# Patient Record
Sex: Male | Born: 1990 | Race: White | Hispanic: No | Marital: Single | Smoking: Current every day smoker
Health system: Southern US, Community
[De-identification: ages and names within clinical notes are randomized; demographics above are authoritative.]

---

## 2012-11-03 ENCOUNTER — Emergency Department
Admission: EM | Admit: 2012-11-03 | Discharge: 2012-11-03 | Disposition: A | Discharge status: Home / Self Care | Payer: 59 | Source: Home / Self Care | Attending: Family Medicine | Admitting: Family Medicine

## 2012-11-03 ENCOUNTER — Emergency Department (INDEPENDENT_AMBULATORY_CARE_PROVIDER_SITE_OTHER): Payer: 59

## 2012-11-03 ENCOUNTER — Ambulatory Visit (INDEPENDENT_AMBULATORY_CARE_PROVIDER_SITE_OTHER): Payer: 59 | Admitting: Sports Medicine

## 2012-11-03 ENCOUNTER — Encounter: Payer: Self-pay | Admitting: *Deleted

## 2012-11-03 DIAGNOSIS — S62309A Unspecified fracture of unspecified metacarpal bone, initial encounter for closed fracture: Secondary | ICD-10-CM

## 2012-11-03 DIAGNOSIS — S62308A Unspecified fracture of other metacarpal bone, initial encounter for closed fracture: Secondary | ICD-10-CM

## 2012-11-03 DIAGNOSIS — S62319A Displaced fracture of base of unspecified metacarpal bone, initial encounter for closed fracture: Secondary | ICD-10-CM

## 2012-11-03 DIAGNOSIS — S62304D Unspecified fracture of fourth metacarpal bone, right hand, subsequent encounter for fracture with routine healing: Secondary | ICD-10-CM

## 2012-11-03 DIAGNOSIS — X58XXXA Exposure to other specified factors, initial encounter: Secondary | ICD-10-CM

## 2012-11-03 DIAGNOSIS — S62304A Unspecified fracture of fourth metacarpal bone, right hand, initial encounter for closed fracture: Secondary | ICD-10-CM | POA: Insufficient documentation

## 2012-11-03 MED ORDER — HYDROCODONE-ACETAMINOPHEN 5-325 MG PO TABS: 1.0000 | ORAL_TABLET | Freq: Three times a day (TID) | ORAL | Status: DC | PRN
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
## 2012-11-03 NOTE — ED Notes (Signed)
Pt c/o RT hand and wrist pain x this morning after punching his truck. No OTC meds.

## 2012-11-03 NOTE — Assessment & Plan Note (Signed)
Fracture to the base of the fourth metacarpal. Velcro wrist brace until swelling resolves. Hydrocodone for pain. Return to see me Wednesday or Tuesday of next week, and I will place a short arm cast.  I billed a fracture code for this visit, all subsequent visits for this complaint will be "post-op checks" in the global period.

## 2012-11-08 ENCOUNTER — Encounter: Payer: Self-pay | Admitting: Sports Medicine

## 2012-11-08 ENCOUNTER — Ambulatory Visit (INDEPENDENT_AMBULATORY_CARE_PROVIDER_SITE_OTHER): Payer: 59 | Admitting: Sports Medicine

## 2012-11-08 VITALS — BP 142/83 | HR 80 | Wt 201.0 lb

## 2012-11-08 DIAGNOSIS — S62309A Unspecified fracture of unspecified metacarpal bone, initial encounter for closed fracture: Secondary | ICD-10-CM

## 2012-11-08 DIAGNOSIS — S62304D Unspecified fracture of fourth metacarpal bone, right hand, subsequent encounter for fracture with routine healing: Secondary | ICD-10-CM

## 2012-11-08 MED ORDER — HYDROCODONE-ACETAMINOPHEN 10-325 MG PO TABS: 1.0000 | ORAL_TABLET | Freq: Three times a day (TID) | ORAL | Status: DC | PRN

## 2012-11-08 NOTE — Progress Notes (Signed)
  Subjective: Kenneth Padilla is now approximately one-week status post fracture of the base of the fourth metacarpal bone after punching his truck. He has been in a Velcro brace. Swelling is improving, pain is much better.   Objective: General: Well-developed, well-nourished, and in no acute distress. There is some mild tenderness to palpation over the base of the fourth metacarpal.  X-rays were reviewed and show stable alignment of the fracture.  Short arm cast was placed.  Assessment/plan:

## 2012-11-08 NOTE — Assessment & Plan Note (Signed)
Swelling has resolved, short arm cast placed. Refilling pain medicine with hydrocodone 10/325. Return to see me in 4 weeks x-ray before visit.

## 2012-11-15 ENCOUNTER — Ambulatory Visit (INDEPENDENT_AMBULATORY_CARE_PROVIDER_SITE_OTHER): Payer: 59 | Admitting: Sports Medicine

## 2012-11-15 ENCOUNTER — Encounter: Payer: Self-pay | Admitting: Sports Medicine

## 2012-11-15 VITALS — BP 129/75 | HR 65 | Wt 197.0 lb

## 2012-11-15 DIAGNOSIS — S62309A Unspecified fracture of unspecified metacarpal bone, initial encounter for closed fracture: Secondary | ICD-10-CM

## 2012-11-15 DIAGNOSIS — S62304D Unspecified fracture of fourth metacarpal bone, right hand, subsequent encounter for fracture with routine healing: Secondary | ICD-10-CM

## 2012-11-15 NOTE — Assessment & Plan Note (Signed)
Return for previously scheduled followup for fracture check.

## 2012-11-15 NOTE — Progress Notes (Signed)
  Subjective: Approximately one week status post nondisplaced fracture at the base of the fourth metacarpal in the right hand after punching his truck. He was feeling some irritation the cast.   Objective: General: Well-developed, well-nourished, and in no acute distress. Cast was removed, noticed no skin breakdown.  Apply new short arm cast, much lower profile.  Assessment/plan:

## 2012-11-16 ENCOUNTER — Ambulatory Visit (INDEPENDENT_AMBULATORY_CARE_PROVIDER_SITE_OTHER): Payer: 59 | Admitting: Sports Medicine

## 2012-11-16 ENCOUNTER — Encounter: Payer: Self-pay | Admitting: Sports Medicine

## 2012-11-16 VITALS — BP 132/78 | HR 63 | Wt 201.0 lb

## 2012-11-16 DIAGNOSIS — S62304D Unspecified fracture of fourth metacarpal bone, right hand, subsequent encounter for fracture with routine healing: Secondary | ICD-10-CM

## 2012-11-16 DIAGNOSIS — IMO0001 Reserved for inherently not codable concepts without codable children: Secondary | ICD-10-CM

## 2012-11-16 NOTE — Assessment & Plan Note (Signed)
Continued discomfort in cast. I applied a Velcro wrist brace, and braced with Coban. I still need to see him for his three-week followup visit.

## 2012-11-16 NOTE — Progress Notes (Signed)
  Subjective: Kenneth Padilla is only about a week out from fracture of the base of the fourth metacarpal his right hand after punching his truck. I changed his cast at the last visit yesterday, unfortunately he's having some irritation over the fifth digit as well as the first webspace.  Objective: General: Well-developed, well-nourished, and in no acute distress. Cast is still in good condition, he does have a slight irritation at the first webspace. Cast was removed. We replaced the Velcro brace, and I strapped this with compressive Coban/dressing.  Assessment/plan:

## 2012-12-04 ENCOUNTER — Ambulatory Visit (INDEPENDENT_AMBULATORY_CARE_PROVIDER_SITE_OTHER): Payer: 59 | Admitting: Sports Medicine

## 2012-12-04 ENCOUNTER — Ambulatory Visit (INDEPENDENT_AMBULATORY_CARE_PROVIDER_SITE_OTHER): Payer: 59

## 2012-12-04 ENCOUNTER — Encounter: Payer: Self-pay | Admitting: Sports Medicine

## 2012-12-04 VITALS — BP 142/82 | HR 62 | Wt 199.0 lb

## 2012-12-04 DIAGNOSIS — IMO0001 Reserved for inherently not codable concepts without codable children: Secondary | ICD-10-CM

## 2012-12-04 DIAGNOSIS — S62304D Unspecified fracture of fourth metacarpal bone, right hand, subsequent encounter for fracture with routine healing: Secondary | ICD-10-CM

## 2012-12-04 MED ORDER — HYDROCODONE-ACETAMINOPHEN 10-325 MG PO TABS: 1.0000 | ORAL_TABLET | Freq: Three times a day (TID) | ORAL | Status: DC | PRN

## 2012-12-04 NOTE — Progress Notes (Signed)
  Subjective: Three-week status post nondisplaced fracture of the right fourth metacarpal base. Doing well. Did have an injury, felt a pop. He has been intolerant of the cast on 3 occasions so far.   Objective: General: Well-developed, well-nourished, and in no acute distress. Still tender over the fracture site.  The hand was restrapped with compressive dressing and brace was reapplied.  Assessment/plan:

## 2012-12-04 NOTE — Assessment & Plan Note (Signed)
Continue compression. Continue brace. Refill pain medication. Return in 3 weeks, he will likely be healed at that point.

## 2012-12-25 ENCOUNTER — Ambulatory Visit: Payer: 59 | Admitting: Sports Medicine

## 2013-01-04 ENCOUNTER — Ambulatory Visit (INDEPENDENT_AMBULATORY_CARE_PROVIDER_SITE_OTHER): Payer: 59 | Admitting: Sports Medicine

## 2013-01-04 ENCOUNTER — Encounter: Payer: Self-pay | Admitting: Sports Medicine

## 2013-01-04 VITALS — BP 140/75 | HR 60 | Wt 198.0 lb

## 2013-01-04 DIAGNOSIS — S62304D Unspecified fracture of fourth metacarpal bone, right hand, subsequent encounter for fracture with routine healing: Secondary | ICD-10-CM

## 2013-01-04 DIAGNOSIS — S62309A Unspecified fracture of unspecified metacarpal bone, initial encounter for closed fracture: Secondary | ICD-10-CM

## 2013-01-04 MED ORDER — HYDROCODONE-ACETAMINOPHEN 5-325 MG PO TABS: 1.0000 | ORAL_TABLET | Freq: Three times a day (TID) | ORAL | Status: DC | PRN

## 2013-01-04 NOTE — Progress Notes (Signed)
  Subjective: Kenneth Padilla is now 7 weeks status post fracture of the right fourth metacarpal bone. Pain continues to improve, almost completely gone. He was unable to tolerate cast for the majority of his treatment course, and we kept him in a Velcro wrist brace.   Objective: General: Well-developed, well-nourished, and in no acute distress. Right hand: Essentially no tenderness to palpation over the fracture site. No swelling, no bruising. Excellent strength.  Assessment/plan:

## 2013-01-04 NOTE — Assessment & Plan Note (Signed)
Fracture is healing well. I strapped his hand with compressive dressing. Refilled hydrocodone only 5/325, #20. Return to see me in 2 weeks.

## 2013-01-19 ENCOUNTER — Ambulatory Visit (INDEPENDENT_AMBULATORY_CARE_PROVIDER_SITE_OTHER): Payer: 59 | Admitting: Sports Medicine

## 2013-01-19 ENCOUNTER — Encounter: Payer: Self-pay | Admitting: Sports Medicine

## 2013-01-19 VITALS — BP 140/91 | HR 51 | Wt 201.0 lb

## 2013-01-19 DIAGNOSIS — I7300 Raynaud's syndrome without gangrene: Secondary | ICD-10-CM

## 2013-01-19 MED ORDER — AMLODIPINE BESYLATE 5 MG PO TABS: ORAL_TABLET | ORAL | Status: DC

## 2013-01-19 NOTE — Patient Instructions (Signed)
Raynaud's Syndrome Raynaud's Syndrome is a disorder of the blood vessels in your hands and feet. It occurs when small arteries of the arms/hands or legs/feet become sensitive to cold or emotional upset. This causes the arteries to constrict, or narrow, and reduces blood flow to the area. The color in the fingers or toes changes from white to bluish to red and this is not usually painful. There may be numbness and tingling. Sores on the skin (ulcers) can form. Symptoms are usually relieved by warming. HOME CARE INSTRUCTIONS   Avoid exposure to cold. Keep your whole body warm and dry. Dress in layers. Wear mittens or gloves when handling ice or frozen food and when outdoors. Use holders for glasses or cans containing cold drinks. If possible, stay indoors during cold weather.  Limit your use of caffeine. Switch to decaffeinated coffee, tea, and soda pop. Avoid chocolate.  Avoid smoking or being around cigarette smoke. Smoke will make symptoms worse.  Wear loose fitting socks and comfortable, roomy shoes.  Avoid vibrating tools and machinery.  If possible, avoid stressful and emotional situations. Exercise, meditation and yoga may help you cope with stress. Biofeedback may be useful.  Ask your caregiver about medicine (calcium channel blockers) that may control Raynaud's phenomena. SEEK MEDICAL CARE IF:   Your discomfort becomes worse, despite conservative treatment.  You develop sores on your fingers and toes that do not heal. Document Released: 02/20/2000 Document Revised: 05/17/2011 Document Reviewed: 02/27/2008 [employer] Patient Information 2014 [employer].  

## 2013-01-19 NOTE — Progress Notes (Signed)
  Subjective:    CC: Followup  HPI: Fracture: 9 weeks status post fracture at the base of the fourth metacarpal bone of the right hand, healed, pain-free.  Pain in the toes and fingers: Occurs whenever it gets cold outside, they intend to turn colors, white, then red when rewarmed. Extremely painful. Moderate, persistent. Has never been treated or diagnosed before.  Past medical history, Surgical history, Family history not pertinant except as noted below, Social history, Allergies, and medications have been entered into the medical record, reviewed, and no changes needed.   Review of Systems: No fevers, chills, night sweats, weight loss, chest pain, or shortness of breath.   Objective:    General: Well Developed, well nourished, and in no acute distress.  Neuro: Alert and oriented x3, extra-ocular muscles intact, sensation grossly intact.  HEENT: Normocephalic, atraumatic, pupils equal round reactive to light, neck supple, no masses, no lymphadenopathy, thyroid nonpalpable.  Skin: Warm and dry, no rashes. Cardiac: Regular rate and rhythm, no murmurs rubs or gallops, no lower extremity edema.  Respiratory: Clear to auscultation bilaterally. Not using accessory muscles, speaking in full sentences. Right hand: No longer tender to palpation at the base of fourth metacarpal bone. Fingers are cold.  Impression and Recommendations:

## 2013-01-19 NOTE — Assessment & Plan Note (Signed)
Starting low dose amlodipine 5 mg one half tab daily. Avoidance with gloves and warm socks. Return to see me in a month to see how things are going.

## 2013-02-16 ENCOUNTER — Ambulatory Visit (INDEPENDENT_AMBULATORY_CARE_PROVIDER_SITE_OTHER): Payer: 59 | Admitting: Sports Medicine

## 2013-02-16 ENCOUNTER — Encounter: Payer: Self-pay | Admitting: Sports Medicine

## 2013-02-16 VITALS — BP 143/78 | HR 63 | Wt 199.0 lb

## 2013-02-16 DIAGNOSIS — I7300 Raynaud's syndrome without gangrene: Secondary | ICD-10-CM

## 2013-02-16 DIAGNOSIS — L909 Atrophic disorder of skin, unspecified: Secondary | ICD-10-CM

## 2013-02-16 DIAGNOSIS — L74519 Primary focal hyperhidrosis, unspecified: Secondary | ICD-10-CM

## 2013-02-16 DIAGNOSIS — I10 Essential (primary) hypertension: Secondary | ICD-10-CM

## 2013-02-16 DIAGNOSIS — L918 Other hypertrophic disorders of the skin: Secondary | ICD-10-CM | POA: Insufficient documentation

## 2013-02-16 DIAGNOSIS — L74512 Primary focal hyperhidrosis, palms: Secondary | ICD-10-CM | POA: Insufficient documentation

## 2013-02-16 MED ORDER — AMLODIPINE BESYLATE 10 MG PO TABS: 10.0000 mg | ORAL_TABLET | Freq: Every day | ORAL | Status: DC

## 2013-02-16 NOTE — Progress Notes (Signed)
  Subjective:    CC: Follow up  HPI: Raynaud phenomenon: Improved significantly on 5 mg of amlodipine.  Hypertension: Improved with amlodipine.  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  Skin lesion: Has several skin tags on the ear, and a couple in the abdomen.  Past medical history, Surgical history, Family history not pertinant except as noted below, Social history, Allergies, and medications have been entered into the medical record, reviewed, and no changes needed.   Review of Systems: No fevers, chills, night sweats, weight loss, chest pain, or shortness of breath.   Objective:    General: Well Developed, well nourished, and in no acute distress.  Neuro: Alert and oriented x3, extra-ocular muscles intact, sensation grossly intact.  HEENT: Normocephalic, atraumatic, pupils equal round reactive to light, neck supple, no masses, no lymphadenopathy, thyroid nonpalpable.  Skin: Warm and dry, no rashes. Skin tag on the left ear, also 3 on the abdomen. Hyperhidrosis of the hands. Cardiac: Regular rate and rhythm, no murmurs rubs or gallops, no lower extremity edema.  Respiratory: Clear to auscultation bilaterally. Not using accessory muscles, speaking in full sentences.  Impression and Recommendations:

## 2013-02-16 NOTE — Assessment & Plan Note (Signed)
Increasing amlodipine to 10 mg daily. Recheck in one month.

## 2013-02-16 NOTE — Assessment & Plan Note (Signed)
Will make appointment for excision of four skin tags.

## 2013-02-16 NOTE — Assessment & Plan Note (Signed)
Increasing amlodipine to 10mg daily.

## 2013-02-16 NOTE — Assessment & Plan Note (Signed)
At this point I am going to refer him to dermatology for consideration of Botox treatments.

## 2013-02-22 ENCOUNTER — Ambulatory Visit: Payer: 59 | Admitting: Sports Medicine

## 2013-02-26 ENCOUNTER — Ambulatory Visit (INDEPENDENT_AMBULATORY_CARE_PROVIDER_SITE_OTHER): Payer: 59 | Admitting: Sports Medicine

## 2013-02-26 ENCOUNTER — Encounter: Payer: Self-pay | Admitting: Sports Medicine

## 2013-02-26 VITALS — BP 145/78 | HR 63 | Wt 199.0 lb

## 2013-02-26 DIAGNOSIS — L918 Other hypertrophic disorders of the skin: Secondary | ICD-10-CM

## 2013-02-26 DIAGNOSIS — L909 Atrophic disorder of skin, unspecified: Secondary | ICD-10-CM

## 2013-02-26 NOTE — Assessment & Plan Note (Signed)
5 abdominal skin tags removed as above.

## 2013-02-26 NOTE — Progress Notes (Signed)
  Subjective:    CC:  Skin tags  HPI:  this pleasant 22 year old male has multiple skin tags on his abdomen and his left ear, he presents for removal.  Past medical history, Surgical history, Family history not pertinant except as noted below, Social history, Allergies, and medications have been entered into the medical record, reviewed, and no changes needed.   Review of Systems: No fevers, chills, night sweats, weight loss, chest pain, or shortness of breath.   Objective:    General: Well Developed, well nourished, and in no acute distress.  Neuro: Alert and oriented x3, extra-ocular muscles intact, sensation grossly intact.  HEENT: Normocephalic, atraumatic, pupils equal round reactive to light, neck supple, no masses, no lymphadenopathy, thyroid nonpalpable.  Skin: Warm and dry, no rashes. Cardiac: Regular rate and rhythm, no murmurs rubs or gallops, no lower extremity edema.  Respiratory: Clear to auscultation bilaterally. Not using accessory muscles, speaking in full sentences.  Procedure: Removal of 5 abdominal skin tags.  Risks, benefits, and alternatives explained and consent obtained. Time out conducted. Surface prepped in a sterile fashion. 1cc lidocaine with epinephine infiltrated under skin tag Adequate anesthesia ensured. Skin tag excised from skin surface. Dressing applied. Pt stable. Pt advised to call or RTC for continued bleeding, spreading erythema/induration, fevers, or chills.  Impression and Recommendations:

## 2013-08-23 ENCOUNTER — Ambulatory Visit (INDEPENDENT_AMBULATORY_CARE_PROVIDER_SITE_OTHER): Payer: 59 | Admitting: Sports Medicine

## 2013-08-23 ENCOUNTER — Encounter: Payer: Self-pay | Admitting: Sports Medicine

## 2013-08-23 VITALS — BP 128/92 | HR 56 | Ht 77.0 in | Wt 186.0 lb

## 2013-08-23 DIAGNOSIS — H9190 Unspecified hearing loss, unspecified ear: Secondary | ICD-10-CM

## 2013-08-23 DIAGNOSIS — F329 Major depressive disorder, single episode, unspecified: Secondary | ICD-10-CM

## 2013-08-23 DIAGNOSIS — G43909 Migraine, unspecified, not intractable, without status migrainosus: Secondary | ICD-10-CM | POA: Insufficient documentation

## 2013-08-23 DIAGNOSIS — R6889 Other general symptoms and signs: Secondary | ICD-10-CM

## 2013-08-23 DIAGNOSIS — L568 Other specified acute skin changes due to ultraviolet radiation: Secondary | ICD-10-CM

## 2013-08-23 MED ORDER — VENLAFAXINE HCL ER 37.5 MG PO CP24: ORAL_CAPSULE | ORAL | Status: DC

## 2013-08-23 MED ORDER — VENLAFAXINE HCL ER 75 MG PO CP24: 75.0000 mg | ORAL_CAPSULE | Freq: Every day | ORAL | Status: DC

## 2013-08-23 NOTE — Assessment & Plan Note (Signed)
With a normal exam of vision and hearing, as well as significant changes at home, I do think this represents major depressive disorder. Referral downstairs for behavioral therapy, I am going to start Effexor. Return in 2 weeks. No SI/HI.

## 2013-08-23 NOTE — Assessment & Plan Note (Signed)
Hearing test is normal. I do think he is experiencing some exposure related hearing loss due to loud machinery and music. At this point I'm going to defer referral to audiology.

## 2013-08-23 NOTE — Assessment & Plan Note (Addendum)
Exam is benign. I do think that this is a normal phenomenon. At this point we are going to defer referral to ophthalmology.
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
## 2013-08-24 ENCOUNTER — Telehealth: Payer: Self-pay

## 2013-08-24 NOTE — Telephone Encounter (Signed)
Patient has been notified. Rhonda Cunningham,CMA  

## 2013-08-24 NOTE — Telephone Encounter (Signed)
Patient called with concerns he is taking Effexor 37.5 mg and he will be switching it to the 75 mg he is taking it in the morning with breakfast he wants to know if he can take it at night because it makes him sleepy. Rhonda Cunningham,CMA

## 2013-08-24 NOTE — Telephone Encounter (Signed)
Yes, that's fine 

## 2013-09-05 ENCOUNTER — Encounter (HOSPITAL_COMMUNITY): Payer: Self-pay | Admitting: Physician Assistant

## 2013-09-05 ENCOUNTER — Ambulatory Visit (INDEPENDENT_AMBULATORY_CARE_PROVIDER_SITE_OTHER): Payer: 59 | Admitting: Physician Assistant

## 2013-09-05 VITALS — BP 120/70 | HR 61 | Ht 77.0 in | Wt 194.0 lb

## 2013-09-05 DIAGNOSIS — F1994 Other psychoactive substance use, unspecified with psychoactive substance-induced mood disorder: Secondary | ICD-10-CM | POA: Insufficient documentation

## 2013-09-05 DIAGNOSIS — F1210 Cannabis abuse, uncomplicated: Secondary | ICD-10-CM | POA: Insufficient documentation

## 2013-09-05 DIAGNOSIS — F411 Generalized anxiety disorder: Secondary | ICD-10-CM

## 2013-09-05 NOTE — Progress Notes (Signed)
Psychiatric Assessment Adult  Patient Identification:  Linford ArnoldZachary Zody Date of Evaluation:  09/05/2013 Chief Complaint: Depression History of Chief Complaint:   Chief Complaint  Patient presents with  . Depression  .Location: [facility] out patient .Quality: Chronic .Severity: moderate to severe .Timing :   Symptoms have never stopped .Duration: since age 23 .Context:  Every single day he is depressed, and he hates his life. 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  He notes that he has seen a number of therapists, thinks he has ADD and functions better when he takes Adderall he gets from a friend. He states he has cut down on smoking THC over the past month, and is now taking Effexor XR 75mg  x 2 weeks.  HPI Review of Systems Physical Exam  Depressive Symptoms: depressed mood, anhedonia, insomnia, psychomotor agitation, fatigue, feelings of worthlessness/guilt, difficulty concentrating, hopelessness, impaired memory, recurrent thoughts of death, anxiety, panic attacks, weight loss,  (Hypo) Manic Symptoms:   Elevated Mood:  Yes Irritable Mood:  Yes Grandiosity:  No Distractibility:  Yes Labiality of Mood:  Yes Delusions:  No Hallucinations:  No Impulsivity:  Yes Sexually Inappropriate Behavior:  No Financial Extravagance:  Yes Flight of Ideas:  No  Anxiety Symptoms: Excessive Worry:  Yes Panic Symptoms:  Yes Agoraphobia:  No Obsessive Compulsive: No  Symptoms: None, Specific Phobias:  No Social Anxiety:  No  Psychotic Symptoms:  Hallucinations: No None Delusions:  No Paranoia:  No   Ideas of Reference:  No  PTSD Symptoms: Ever had a traumatic exposure:  No Had a traumatic exposure in the last month:  Yes Re-experiencing: No None Hypervigilance:  No Hyperarousal: No None Avoidance: No None  Traumatic Brain Injury: No   Past Psychiatric History: Diagnosis: Depression   Hospitalizations: none  Outpatient Care:  Years ago  Substance Abuse Care: none  Self-Mutilation: none  Suicidal Attempts: 6, including OD, Suffocating, hanging, ran truck into woods.  Violent Behaviors: none   Past Medical History:  History reviewed. No pertinent past medical history. History of Loss of Consciousness:  No Seizure History:  No Cardiac History:  No Allergies:  No Known Allergies Current Medications:  Current Outpatient Prescriptions  Medication Sig Dispense Refill  . venlafaxine XR (EFFEXOR XR) 75 MG 24 hr capsule Take 1 capsule (75 mg total) by mouth daily with breakfast.  30 capsule  1  . venlafaxine XR (EFFEXOR XR) 37.5 MG 24 hr capsule One tab by mouth daily for 3 days then switch to the higher dose  3 capsule  0   No current facility-administered medications for this visit.    Previous Psychotropic Medications:  Medication Dose   Strattera      zoloft made him sick and tired     Symbiax did well x 8 months    Prozac     Wellbutrin     Abilify      ?Paxil    Substance Abuse History in the last 12 months: THC 3-5 x a day since age 23, Acid x 3, cocaine last use 2009, snorting,  Recently started drinking again Medical Consequences of Substance Abuse: possession charges  Legal Consequences of Substance Abuse: no problems at this time.  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 Diabetes Father   . Hypertension Father     Mental Status Examination/Evaluation: Objective:  Appearance: Disheveled  Eye Contact::  Good  Speech:  Clear and Coherent  Volume:  Normal  Mood:  Anxious depressed  Affect:  Congruent and Tearful  Thought Process:  Coherent, Goal Directed, Linear and Logical  Orientation:  Full (Time, Place, and Person)  Thought Content:  WDL  Suicidal Thoughts:  Yes.  without intent/plan  Homicidal Thoughts:  No  Judgement:  Good  Insight:  Present  Psychomotor Activity:  Restlessness  Akathisia:  No  Handed:  Right  AIMS (if indicated):    Assets:  Communication Skills Desire for Improvement Financial Resources/Insurance Housing Physical Health Social Support Talents/Skills Transportation Vocational/Educational    Laboratory/X-Ray Psychological Evaluation(s)   UDS, CMP, CBC     Assessment:    AXIS I Cannabis abuse, SIMDO, r/o MDD vs BPD  AXIS II Deferred  AXIS III History reviewed. No pertinent past medical history.   AXIS IV problems related to social environment and problems with primary support group  AXIS V 51-60 moderate symptoms   Treatment Plan/Recommendations:  Plan of Care: 1. Continue Effexor XR, take this in the AM. 2. Regular sleep wake schedule as discussed. 3. Continue to decrease alcohol and THC use as discussed. 4. Will order labs today as noted above. 5.  Patient will follow up in 2 weeks.  Laboratory:  CBC Chemistry Profile UDS  Psychotherapy: will consider OPT when a better therapeutic relationship is established and patient is more stable.  Medications: Continue Effexor XR 75 mg in AM.  Routine PRN Medications:  No  Consultations: if needed  Safety Concerns:  None at this time.  Other:      Imanie Darrow, PA-C 7/1/20152:17 PM

## 2013-09-06 ENCOUNTER — Ambulatory Visit (INDEPENDENT_AMBULATORY_CARE_PROVIDER_SITE_OTHER): Payer: 59 | Admitting: Sports Medicine

## 2013-09-06 ENCOUNTER — Encounter: Payer: Self-pay | Admitting: Sports Medicine

## 2013-09-06 VITALS — BP 141/84 | HR 55 | Ht 77.0 in | Wt 191.0 lb

## 2013-09-06 DIAGNOSIS — G43709 Chronic migraine without aura, not intractable, without status migrainosus: Secondary | ICD-10-CM

## 2013-09-06 DIAGNOSIS — F321 Major depressive disorder, single episode, moderate: Secondary | ICD-10-CM

## 2013-09-06 LAB — DRUG SCREEN, URINE
AMPHETAMINE SCRN UR: NEGATIVE
BARBITURATE QUANT UR: NEGATIVE
Benzodiazepines.: NEGATIVE
COCAINE METABOLITES: NEGATIVE
Creatinine,U: 94.01 mg/dL
MARIJUANA METABOLITE: POSITIVE — AB
METHADONE: NEGATIVE
Opiates: NEGATIVE
Phencyclidine (PCP): NEGATIVE
Propoxyphene: NEGATIVE

## 2013-09-06 LAB — CBC WITH DIFFERENTIAL/PLATELET
Basophils Absolute: 0 10*3/uL (ref 0.0–0.1)
Basophils Relative: 0 % (ref 0–1)
EOS ABS: 0.3 10*3/uL (ref 0.0–0.7)
EOS PCT: 3 % (ref 0–5)
HEMATOCRIT: 45.5 % (ref 39.0–52.0)
HEMOGLOBIN: 16 g/dL (ref 13.0–17.0)
LYMPHS ABS: 2.7 10*3/uL (ref 0.7–4.0)
LYMPHS PCT: 32 % (ref 12–46)
MCH: 31.5 pg (ref 26.0–34.0)
MCHC: 35.2 g/dL (ref 30.0–36.0)
MCV: 89.6 fL (ref 78.0–100.0)
MONOS PCT: 8 % (ref 3–12)
Monocytes Absolute: 0.7 10*3/uL (ref 0.1–1.0)
Neutro Abs: 4.8 10*3/uL (ref 1.7–7.7)
Neutrophils Relative %: 57 % (ref 43–77)
Platelets: 183 10*3/uL (ref 150–400)
RBC: 5.08 MIL/uL (ref 4.22–5.81)
RDW: 14.1 % (ref 11.5–15.5)
WBC: 8.4 10*3/uL (ref 4.0–10.5)

## 2013-09-06 LAB — COMPLETE METABOLIC PANEL WITH GFR
ALBUMIN: 4.6 g/dL (ref 3.5–5.2)
ALK PHOS: 53 U/L (ref 39–117)
ALT: 16 U/L (ref 0–53)
AST: 21 U/L (ref 0–37)
BILIRUBIN TOTAL: 0.3 mg/dL (ref 0.2–1.2)
BUN: 15 mg/dL (ref 6–23)
CO2: 31 meq/L (ref 19–32)
Calcium: 9.5 mg/dL (ref 8.4–10.5)
Chloride: 99 mEq/L (ref 96–112)
Creat: 0.94 mg/dL (ref 0.50–1.35)
GLUCOSE: 84 mg/dL (ref 70–99)
POTASSIUM: 4.5 meq/L (ref 3.5–5.3)
SODIUM: 139 meq/L (ref 135–145)
TOTAL PROTEIN: 6.7 g/dL (ref 6.0–8.3)

## 2013-09-06 MED ORDER — KETOROLAC TROMETHAMINE 30 MG/ML IJ SOLN
30.0000 mg | Freq: Once | INTRAMUSCULAR | Status: AC
  Administered 2013-09-06: 30 mg via INTRAMUSCULAR

## 2013-09-06 NOTE — Progress Notes (Signed)
  Subjective:    CC: Followup  HPI: Depression: Stable on Effexor 75 mg daily, he is also seeing psychiatry downstairs, no suicidal or homicidal ideation, does not yet feel better as expected.  Headache: bitemporal, throbbing, with photophobia, no nausea, no folliculitis and signs, trauma. No constitutional symptoms. Mild, persistent.  Past medical history, Surgical history, Family history not pertinant except as noted below, Social history, Allergies, and medications have been entered into the medical record, reviewed, and no changes needed.   Review of Systems: No fevers, chills, night sweats, weight loss, chest pain, or shortness of breath.   Objective:    General: Well Developed, well nourished, and in no acute distress.  Neuro: Alert and oriented x3, extra-ocular muscles intact, sensation grossly intact.  HEENT: Normocephalic, atraumatic, pupils equal round reactive to light, neck supple, no masses, no lymphadenopathy, thyroid nonpalpable.  Skin: Warm and dry, no rashes. Cardiac: Regular rate and rhythm, no murmurs rubs or gallops, no lower extremity edema.  Respiratory: Clear to auscultation bilaterally. Not using accessory muscles, speaking in full sentences.  Impression and Recommendations:

## 2013-09-06 NOTE — Assessment & Plan Note (Signed)
Toradol 30 mg intramuscular.

## 2013-09-06 NOTE — Assessment & Plan Note (Signed)
Continue venlafaxine 75 mg. Continue consultation with psychiatry. Not much better which is expected, continue medication, return to see me in one month.

## 2013-09-19 ENCOUNTER — Ambulatory Visit (HOSPITAL_COMMUNITY): Payer: Self-pay | Admitting: Physician Assistant

## 2013-09-21 ENCOUNTER — Ambulatory Visit (INDEPENDENT_AMBULATORY_CARE_PROVIDER_SITE_OTHER): Payer: 59 | Admitting: Physician Assistant

## 2013-09-21 ENCOUNTER — Encounter (HOSPITAL_COMMUNITY): Payer: Self-pay | Admitting: Physician Assistant

## 2013-09-21 ENCOUNTER — Encounter: Payer: Self-pay | Admitting: Physician Assistant

## 2013-09-21 VITALS — BP 133/73 | HR 66 | Ht 77.0 in | Wt 207.0 lb

## 2013-09-21 VITALS — BP 137/69 | HR 63 | Ht 77.0 in | Wt 208.0 lb

## 2013-09-21 DIAGNOSIS — F331 Major depressive disorder, recurrent, moderate: Secondary | ICD-10-CM

## 2013-09-21 DIAGNOSIS — Z7251 High risk heterosexual behavior: Secondary | ICD-10-CM

## 2013-09-21 MED ORDER — VENLAFAXINE HCL ER 75 MG PO CP24: 75.0000 mg | ORAL_CAPSULE | Freq: Every day | ORAL | Status: DC

## 2013-09-21 NOTE — Progress Notes (Signed)
   [facility]  Kenneth ArnoldZachary Padilla Apr 11, 1990  Date: 09/21/2013    Subjective: The patient is in today to follow up on his start of Effexor XR 75mg . He states he feels better and reports yawning as a side effect, but he does note that it makes him sleepy so Dr. Karie Schwalbe. Has instructed him to take it at night. Ep[employer] research did show yawning, insomnia, and somnolence as side effects.     He is getting more sleep at night and is making a strong effort to be home and in bed at a reasonable time. He also continues to cut down on his marijuana use.      Kenneth CoderZachary also notes that his 25 year old brother has Wegner's Granulomatosis and is in kidney failure. He will need to go on dialysis in the near future. Kenneth MalkinZach has painted a very dim view of the near future and feels that his brother is going to go down hill quickly as he is beginning to "look ill" and "he's tired all the time now." Filed Vitals:   09/21/13 0950  BP: 137/69  Pulse: 63    Mental Status Examination  Appearance: WDWN WM NAD Alert: Yes Attention: good  Cooperative: Yes Eye Contact: Good Speech: clear goal directed normal rate and rhythm Psychomotor Activity: Normal Memory/Concentration:  Oriented: place and time/date Mood: Anxious and Depressed states depression is worse than anxiety. Affect: Appropriate and Congruent Thought Processes and Associations: Goal Directed Fund of Knowledge: Good Thought Content:  Insight: Good Judgement: Good  Testing: Zung Depression scale:  53/80 = mild depression Zung Anxiety scale: Unable to complete due to poor internet connection.   Diagnosis:  AXIS I  Cannabis abuse, SIMDO, r/o MDD vs BPD   AXIS II  Deferred   AXIS III  History reviewed. No pertinent past medical history.   AXIS IV  problems related to social environment and problems with primary support group   AXIS V  51-60 moderate symptoms     Treatment Plan:  1. No changes in medications  at this time. 2. Discussed risk reduction and overall reduction of THC as very important to Kenneth Padilla recovery. 3.Stressed the importance of continuing to get plenty of rest on a regular basis. 4. Also recommend counseling with H. Coble for stress reduction and to decrease his negative perception habit even in the face of his brother's illness. 5. Follow up 4-6 weeks. Kenneth RavensNeil T. Padilla All RPAC 10:26 AM 09/21/2013

## 2013-09-21 NOTE — Patient Instructions (Signed)
1.continue plenty of rest and regular sleep. 2. Continue to cut down on THC. 3. Schedule with H. Coble 4. Follow up 6 weeks.

## 2013-09-21 NOTE — Progress Notes (Signed)
   Subjective:    Patient ID: Kenneth Padilla, male    DOB: [birth year], 23 y.o.   MRN: [id number]  HPI The patient is a 23 year old male who presents to the clinic to have STD testing. He recently has ended a relationship with his girlfriend. It was found out that she had cheated on him with multiple times. He generally likes to get STD testing when she really wanted to do so after ending the relationship with her. He denies any penile lesions, penile discharge, penile pain or any other complications or problems. He denies any fever at, chills today.     Review of Systems  All other systems reviewed and are negative.      Objective:   Physical Exam  Constitutional: He is oriented to person, place, and time. He appears well-developed and well-nourished.  HENT:  Head: Normocephalic and atraumatic.  Cardiovascular: Normal rate, regular rhythm and normal heart sounds.   Pulmonary/Chest: Effort normal and breath sounds normal.  Neurological: He is alert and oriented to person, place, and time.  Skin: Skin is dry.  Psychiatric: He has a normal mood and affect. His behavior is normal.          Assessment & Plan:  High-risk sexual behavior-HIV, RPR, herpes was printed out for patient to have labs drawn. GC and Chlamydia were given for patient to capture urine sample and bring back to the lab. Patient has urinated within the hour and cannot give a urine specimen today. Discussed with patient to get first morning urine Monday morning and bring to the lab. We'll call with results. Discussed importance of using condoms for STD prevention.

## 2013-10-04 ENCOUNTER — Ambulatory Visit: Payer: Self-pay | Admitting: Sports Medicine

## 2013-10-04 DIAGNOSIS — Z0289 Encounter for other administrative examinations: Secondary | ICD-10-CM

## 2013-10-05 ENCOUNTER — Ambulatory Visit (HOSPITAL_COMMUNITY): Payer: Self-pay | Admitting: Professional Counselor

## 2013-11-02 ENCOUNTER — Telehealth (HOSPITAL_COMMUNITY): Payer: Self-pay | Admitting: Physician Assistant

## 2013-11-02 ENCOUNTER — Ambulatory Visit (HOSPITAL_COMMUNITY): Payer: Self-pay | Admitting: Physician Assistant

## 2013-11-02 NOTE — Telephone Encounter (Signed)
11/02/2013 Patient NS'd his appointment this morning. Attempted to call, but mail box is full so I could not leave a message. Metha Kolasa

## 2013-11-26 ENCOUNTER — Ambulatory Visit (INDEPENDENT_AMBULATORY_CARE_PROVIDER_SITE_OTHER): Payer: 59 | Admitting: Physician Assistant

## 2013-11-26 ENCOUNTER — Encounter: Payer: Self-pay | Admitting: Physician Assistant

## 2013-11-26 VITALS — BP 130/79 | HR 57 | Temp 97.9°F | Ht 77.0 in | Wt 201.0 lb

## 2013-11-26 DIAGNOSIS — R05 Cough: Secondary | ICD-10-CM

## 2013-11-26 DIAGNOSIS — R062 Wheezing: Secondary | ICD-10-CM

## 2013-11-26 DIAGNOSIS — J209 Acute bronchitis, unspecified: Secondary | ICD-10-CM

## 2013-11-26 DIAGNOSIS — R059 Cough, unspecified: Secondary | ICD-10-CM

## 2013-11-26 MED ORDER — IPRATROPIUM-ALBUTEROL 0.5-2.5 (3) MG/3ML IN SOLN
3.0000 mL | Freq: Once | RESPIRATORY_TRACT | Status: AC
  Administered 2013-11-26: 3 mL via RESPIRATORY_TRACT

## 2013-11-26 MED ORDER — ALBUTEROL SULFATE 108 (90 BASE) MCG/ACT IN AEPB: 2.0000 | INHALATION_SPRAY | RESPIRATORY_TRACT | Status: AC | PRN

## 2013-11-26 MED ORDER — AZITHROMYCIN 250 MG PO TABS: ORAL_TABLET | ORAL | Status: DC

## 2013-11-26 MED ORDER — PREDNISONE 50 MG PO TABS: ORAL_TABLET | ORAL | Status: DC

## 2013-11-26 NOTE — Progress Notes (Signed)
   Subjective:    Patient ID: Kenneth Padilla, male    DOB: 09/20/1990, 23 y.o.   MRN: [id number]  HPI Pt presents to the clinic with one week of chest tightness, productive cough, wheezing, congestion. Tried mucinex and zyrtec with no benefit. No fever. Does feel hot and cold. No ear pain. Does have ST and sinus pressure.    Review of Systems  All other systems reviewed and are negative.      Objective:   Physical Exam  Constitutional: He is oriented to person, place, and time. He appears well-developed and well-nourished.  HENT:  Head: Normocephalic and atraumatic.  Right Ear: External ear normal.  Left Ear: External ear normal.  Mouth/Throat: Oropharynx is clear and moist. No oropharyngeal exudate.  TM's clear bilatrerally.  Nasal turbinates red and swollen.   Eyes: Conjunctivae are normal. Right eye exhibits no discharge. Left eye exhibits no discharge.  Neck: Normal range of motion. Neck supple.  Cardiovascular: Normal rate, regular rhythm and normal heart sounds.   Pulmonary/Chest:  Bilateral lung wheezing and rhonchi.   Lymphadenopathy:    He has cervical adenopathy.  Neurological: He is alert and oriented to person, place, and time.  Skin: Skin is dry.  Psychiatric: He has a normal mood and affect. His behavior is normal.          Assessment & Plan:  Acute bronchitis/wheezing- duoneb given in office today. Modest improvement with wheezing. Treated with zpak and given pneumonia coverage with prednisone burst. Albuterol inhaler to use every 4-6 hours. Call if not improving in 36 hours. Written out of work for 24 hours.

## 2013-11-26 NOTE — Patient Instructions (Signed)

## 2013-11-30 ENCOUNTER — Ambulatory Visit (INDEPENDENT_AMBULATORY_CARE_PROVIDER_SITE_OTHER): Payer: 59 | Admitting: Family Medicine

## 2013-11-30 ENCOUNTER — Encounter: Payer: Self-pay | Admitting: Family Medicine

## 2013-11-30 VITALS — BP 146/73 | HR 62 | Temp 98.2°F | Wt 200.0 lb

## 2013-11-30 DIAGNOSIS — J189 Pneumonia, unspecified organism: Secondary | ICD-10-CM

## 2013-11-30 MED ORDER — LEVOFLOXACIN 500 MG PO TABS: 500.0000 mg | ORAL_TABLET | Freq: Every day | ORAL | Status: AC

## 2013-11-30 MED ORDER — PREDNISONE 20 MG PO TABS: ORAL_TABLET | ORAL | Status: AC

## 2013-11-30 NOTE — Progress Notes (Signed)
CC: Kenneth Padilla is a 23 y.o. male is here for f/u bronchitis   Subjective: HPI:  Complains of productive cough and wheezing that has been present for the past week and a half. Has been taking 4 days of Z-Pak and prednisone in states that he is no better since taking this medication. Symptoms are greatly improved for 5 minutes after taking albuterol however he can only take this every 4 hours.  Has had occasional fevers and chills and shortness of breath. Denies chest pain, blood in sputum, confusion. Is present all hours of the day and nothing else other than above helps or worsening symptoms.  There's been no unintentional weight loss, nausea, facial pressure, nasal congestion or sore throat   Review Of Systems Outlined In HPI  No past medical history on file.  No past surgical history on file. Family History  Problem Relation Age of Onset  . Diabetes Father   . Hypertension Father     History   Social History  . Marital Status: Single    Spouse Name: N/A    Number of Children: N/A  . Years of Education: N/A   Occupational History  . Not on file.   Social History Main Topics  . Smoking status: Current Every Day Smoker -- 1.00 packs/day for 12 years    Types: Cigarettes  . Smokeless tobacco: Not on file  . Alcohol Use: 12.0 oz/week    24 drink(s) per week     Comment: Normally does not drink but has begun so recently.  . Drug Use: Yes     Comment: THC q d for 12 years 4-5 x a day each day. but he has cut down to 3 x a week over the past month.  . Sexual Activity: Not on file   Other Topics Concern  . Not on file   Social History Narrative  . No narrative on file     Objective: BP 146/73  Pulse 62  Temp(Src) 98.2 F (36.8 C) (Oral)  Wt 200 lb (90.719 kg)  SpO2 97%  General: Alert and Oriented, No Acute Distress HEENT: Pupils equal, round, reactive to light. Conjunctivae clear.  External ears unremarkable, canals clear with intact TMs with appropriate  landmarks.  Middle ear appears open without effusion. Pink inferior turbinates.  Moist mucous membranes, pharynx without inflammation nor lesions.  Neck supple without palpable lymphadenopathy nor abnormal masses. Lungs: Comfortable work of breathing with moderate rhonchi and wheezing in all lung fields no rales. Extremities: No peripheral edema.  Strong peripheral pulses.  Mental Status: No depression, anxiety, nor agitation. Skin: Warm and dry.  Assessment & Plan: [name] was seen today for f/u bronchitis.  Diagnoses and associated orders for this visit:  CAP (community acquired pneumonia) - levofloxacin (LEVAQUIN) 500 MG tablet; Take 1 tablet (500 mg total) by mouth daily. - predniSONE (DELTASONE) 20 MG tablet; Three tabs daily days 1-3, two tabs daily days 4-6, one tab daily days 7-9, half tab daily days 10-13.    Exam is concerning for community acquired pneumonia therefore stop azithromycin, begin Levaquin and continue with prednisone but now in a taper regimen.Signs and symptoms requring emergent/urgent reevaluation were discussed with the patient.   Return if symptoms worsen or fail to improve.

## 2014-02-20 ENCOUNTER — Ambulatory Visit (INDEPENDENT_AMBULATORY_CARE_PROVIDER_SITE_OTHER): Payer: 59 | Admitting: Sports Medicine

## 2014-02-20 ENCOUNTER — Encounter: Payer: Self-pay | Admitting: Sports Medicine

## 2014-02-20 VITALS — BP 135/79 | HR 59 | Ht 77.5 in | Wt 198.0 lb

## 2014-02-20 DIAGNOSIS — R229 Localized swelling, mass and lump, unspecified: Secondary | ICD-10-CM

## 2014-02-20 DIAGNOSIS — R220 Localized swelling, mass and lump, head: Secondary | ICD-10-CM | POA: Insufficient documentation

## 2014-02-20 MED ORDER — DOXYCYCLINE HYCLATE 100 MG PO TABS: 100.0000 mg | ORAL_TABLET | Freq: Two times a day (BID) | ORAL | Status: AC

## 2014-02-20 NOTE — Progress Notes (Signed)
  Subjective:    CC: Painful bump on head  HPI: For the past several weeks Kenneth Padilla has had an increasingly painful bump on the back of his head, left side, near the origin of the sternocleidomastoid. He denies any constitutional symptoms, or upper respiratory symptoms, pain is moderate, persistent.  Past medical history, Surgical history, Family history not pertinant except as noted below, Social history, Allergies, and medications have been entered into the medical record, reviewed, and no changes needed.   Review of Systems: No fevers, chills, night sweats, weight loss, chest pain, or shortness of breath.   Objective:    General: Well Developed, well nourished, and in no acute distress.  Neuro: Alert and oriented x3, extra-ocular muscles intact, sensation grossly intact.  HEENT: Normocephalic, atraumatic, pupils equal round reactive to light, neck supple, no masses, no lymphadenopathy, thyroid nonpalpable. Oropharynx, nasopharynx, ear canals are unremarkable, there is a tender palpable nodule just caudal to the origin of the sternocleidomastoid. It is somewhat fluctuant, tender. No drainage. Skin: Warm and dry, no rashes. Cardiac: Regular rate and rhythm, no murmurs rubs or gallops, no lower extremity edema.  Respiratory: Clear to auscultation bilaterally. Not using accessory muscles, speaking in full sentences.  Impression and Recommendations:

## 2014-02-20 NOTE — Patient Instructions (Signed)
Epidermal Cyst An epidermal cyst is sometimes called a sebaceous cyst, epidermal inclusion cyst, or infundibular cyst. These cysts usually contain a substance that looks "pasty" or "cheesy" and may have a bad smell. This substance is a protein called keratin. Epidermal cysts are usually found on the face, neck, or trunk. They may also occur in the vaginal area or other parts of the genitalia of both men and women. Epidermal cysts are usually small, painless, slow-growing bumps or lumps that move freely under the skin. It is important not to try to pop them. This may cause an infection and lead to tenderness and swelling. CAUSES  Epidermal cysts may be caused by a deep penetrating injury to the skin or a plugged hair follicle, often associated with acne. SYMPTOMS  Epidermal cysts can become inflamed and cause:  Redness.  Tenderness.  Increased temperature of the skin over the bumps or lumps.  Grayish-white, bad smelling material that drains from the bump or lump. DIAGNOSIS  Epidermal cysts are easily diagnosed by your caregiver during an exam. Rarely, a tissue sample (biopsy) may be taken to rule out other conditions that may resemble epidermal cysts. TREATMENT   Epidermal cysts often get better and disappear on their own. They are rarely ever cancerous.  If a cyst becomes infected, it may become inflamed and tender. This may require opening and draining the cyst. Treatment with antibiotics may be necessary. When the infection is gone, the cyst may be removed with minor surgery.  Small, inflamed cysts can often be treated with antibiotics or by injecting steroid medicines.  Sometimes, epidermal cysts become large and bothersome. If this happens, surgical removal in your caregiver's office may be necessary. HOME CARE INSTRUCTIONS  Only take over-the-counter or prescription medicines as directed by your caregiver.  Take your antibiotics as directed. Finish them even if you start to feel  better. SEEK MEDICAL CARE IF:   Your cyst becomes tender, red, or swollen.  Your condition is not improving or is getting worse.  You have any other questions or concerns. MAKE SURE YOU:  Understand these instructions.  Will watch your condition.  Will get help right away if you are not doing well or get worse. Document Released: 01/24/2004 Document Revised: 05/17/2011 Document Reviewed: 08/31/2010 Exit[employer] Patient Information 2015 [employer]. This information is not intended to replace advice given to you by your health care provider. Make sure you discuss any questions you have with your health care provider.  

## 2014-02-20 NOTE — Assessment & Plan Note (Addendum)
Clinically this does represent a sebaceous cyst versus upper cervical lymphadenopathy. It does seem somewhat high to be a lymph node and he has no preceding upper respiratory symptoms. Doxycycline for 7 days. Return to see me after 7 days, if no resolution or worsening we will do an intralesional injection before considering excision in the office.

## 2014-03-04 ENCOUNTER — Ambulatory Visit: Payer: Self-pay | Admitting: Sports Medicine

## 2014-03-05 ENCOUNTER — Ambulatory Visit: Payer: Self-pay | Admitting: Sports Medicine

## 2014-03-05 DIAGNOSIS — Z0289 Encounter for other administrative examinations: Secondary | ICD-10-CM

## 2015-02-20 IMAGING — CR DG WRIST COMPLETE 3+V*R*
2 series · 2 of 2 positions shown · non-contrast
Comparison: 11/03/2012 hand radiographs

CLINICAL DATA: The patient punched a truck this morning and has
pain and swelling in the hand and wrist.

EXAM:
RIGHT WRIST - COMPLETE 3+ VIEW

[view not recorded (1 of 2)]
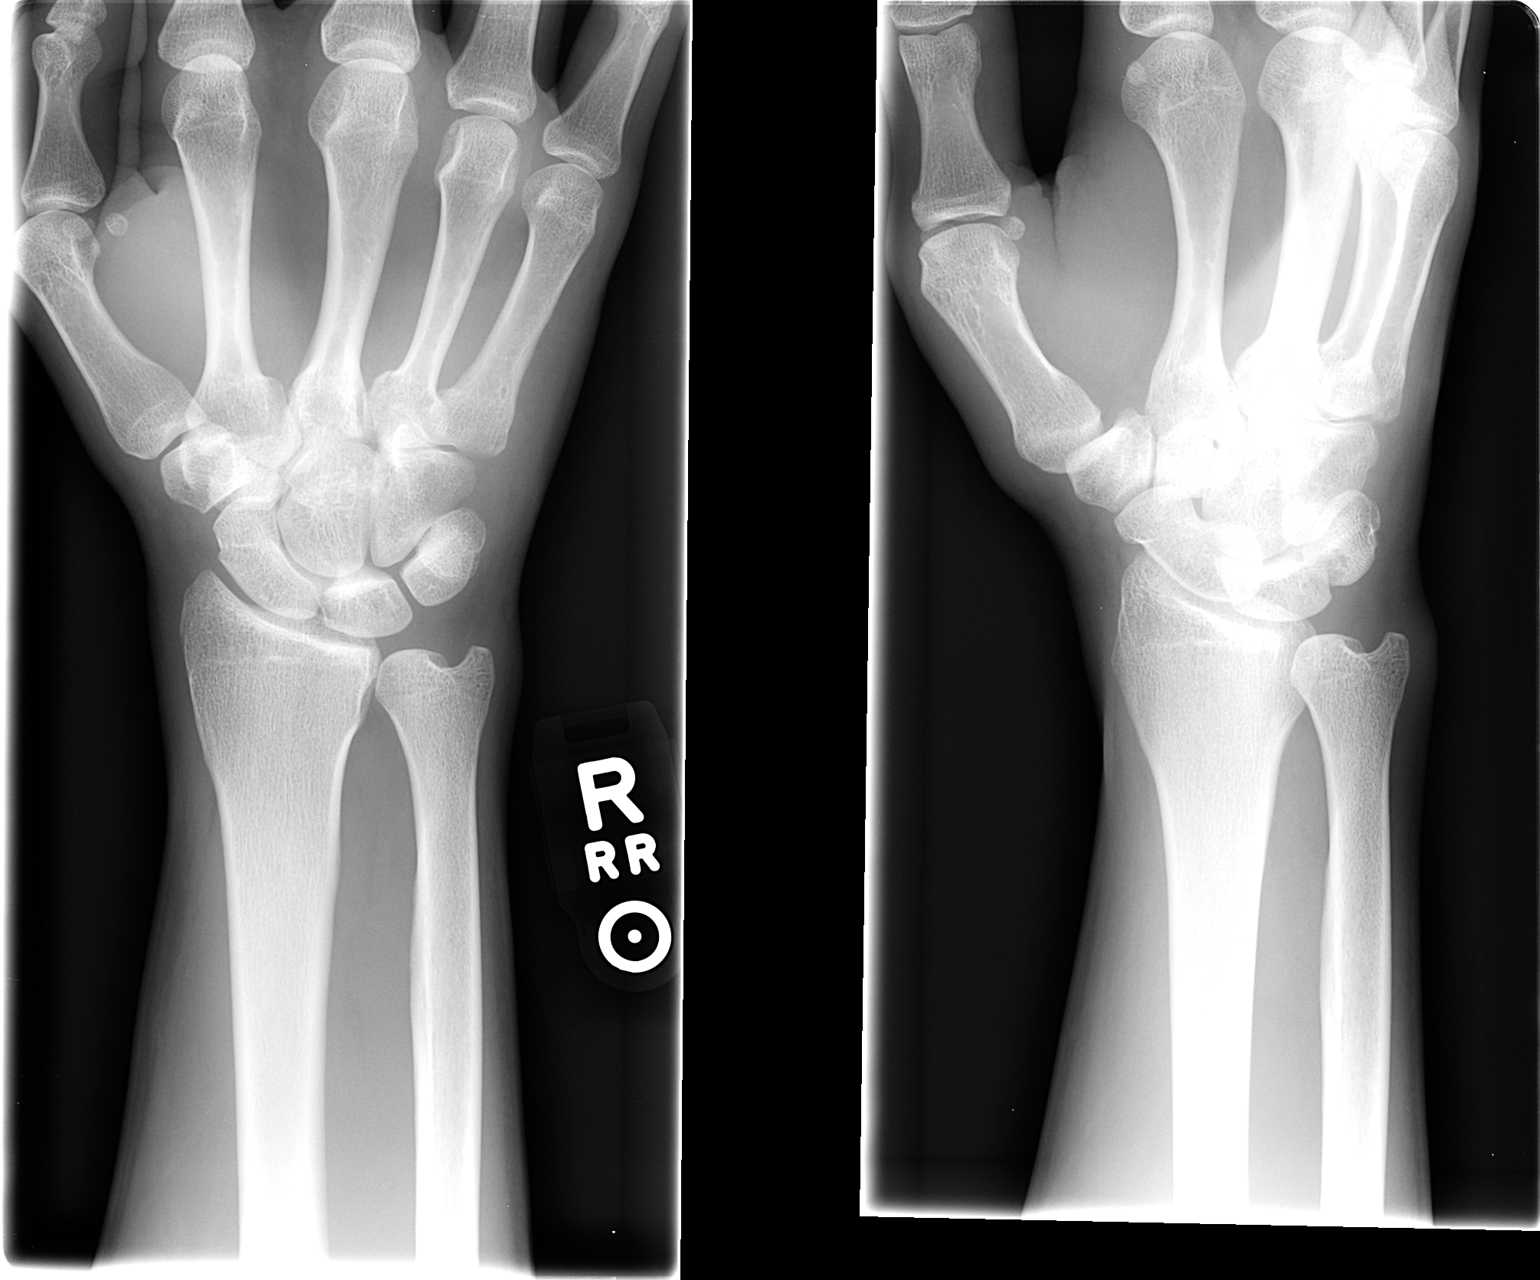

[view not recorded (2 of 2)]
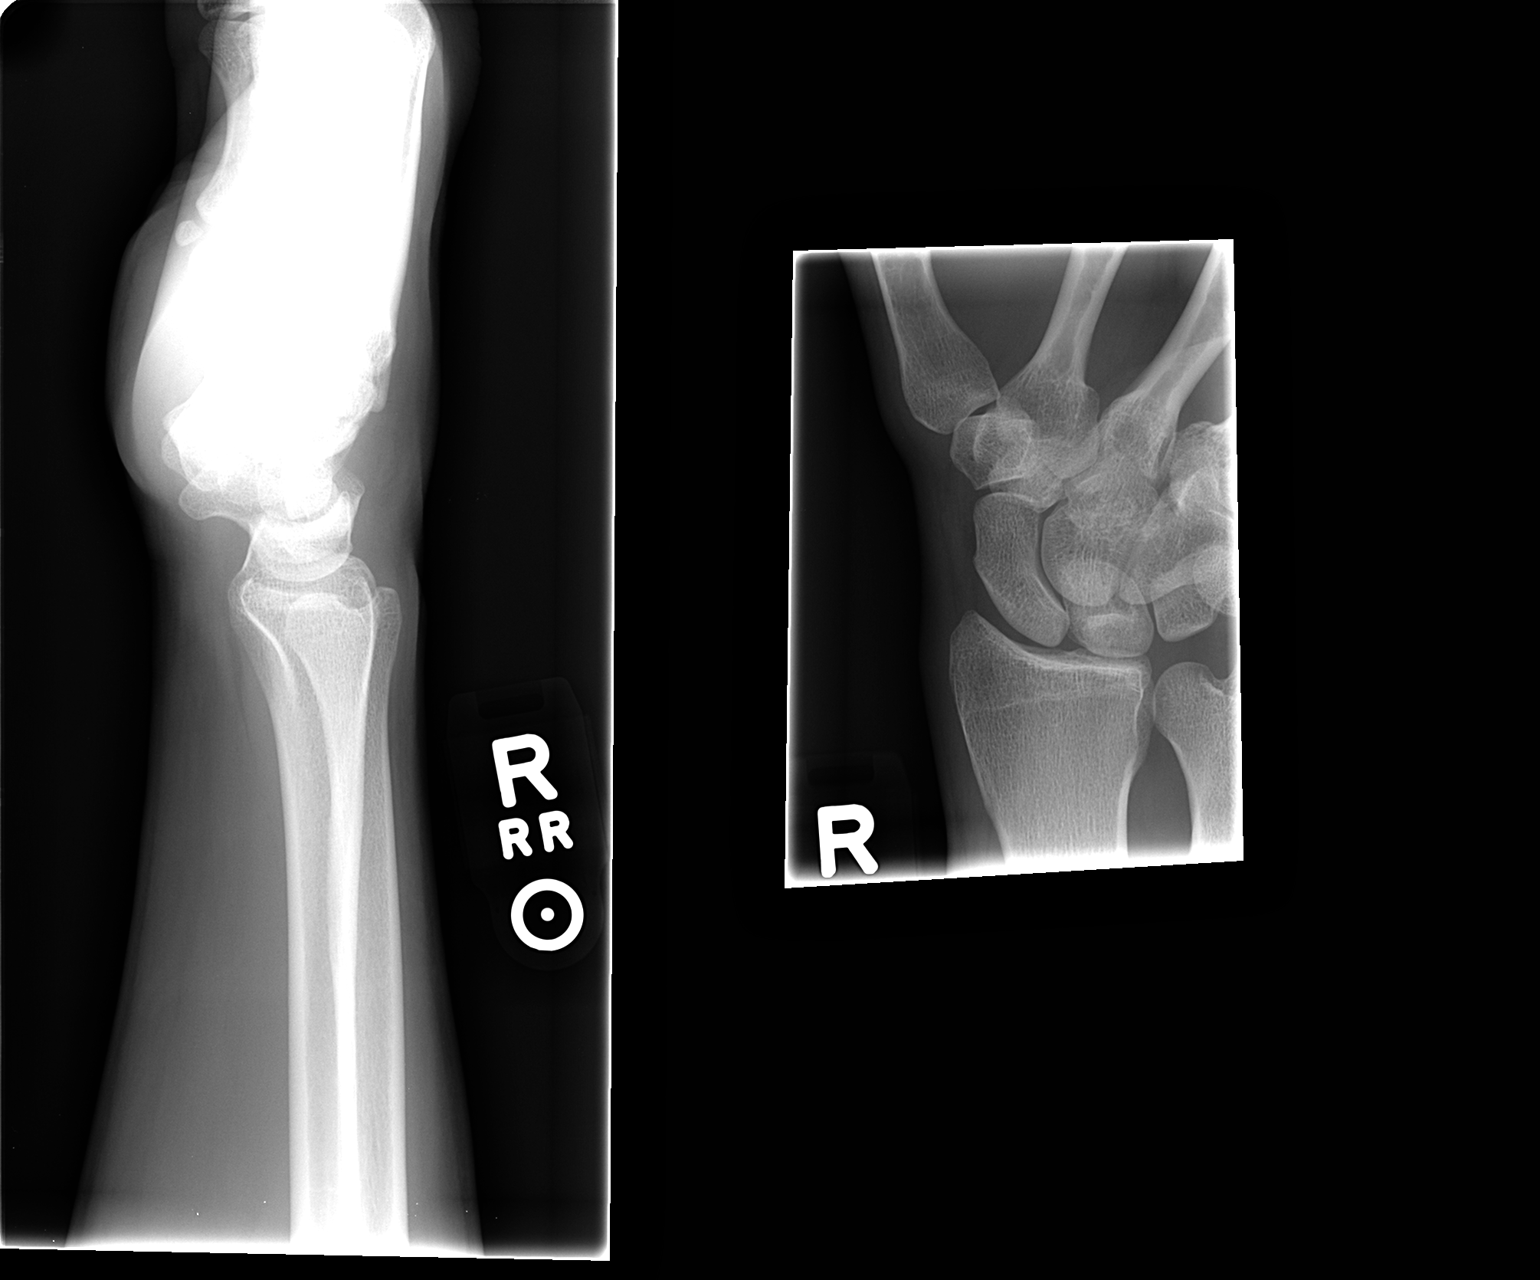

[2 of 2 positions shown; findings below may reference images not displayed]

FINDINGS: Longitudinal fracture involving the radial side of the proximal
metaphysis of the 4th metacarpal noted. Mid scaphoid ridging without
discrete scaphoid fracture observed. Otherwise, no significant
abnormalities are observed.
IMPRESSION: 1. Longitudinal fracture of the radial side of the proximal
metaphysis of the 4th metacarpal.

## 2015-03-23 IMAGING — CR DG HAND COMPLETE 3+V*R*
3 series · 3 of 3 positions shown · non-contrast
Comparison: 11/03/2012

CLINICAL DATA: Followup metacarpal fracture

EXAM:
RIGHT HAND - COMPLETE 3+ VIEW

[view not recorded (1 of 3)]
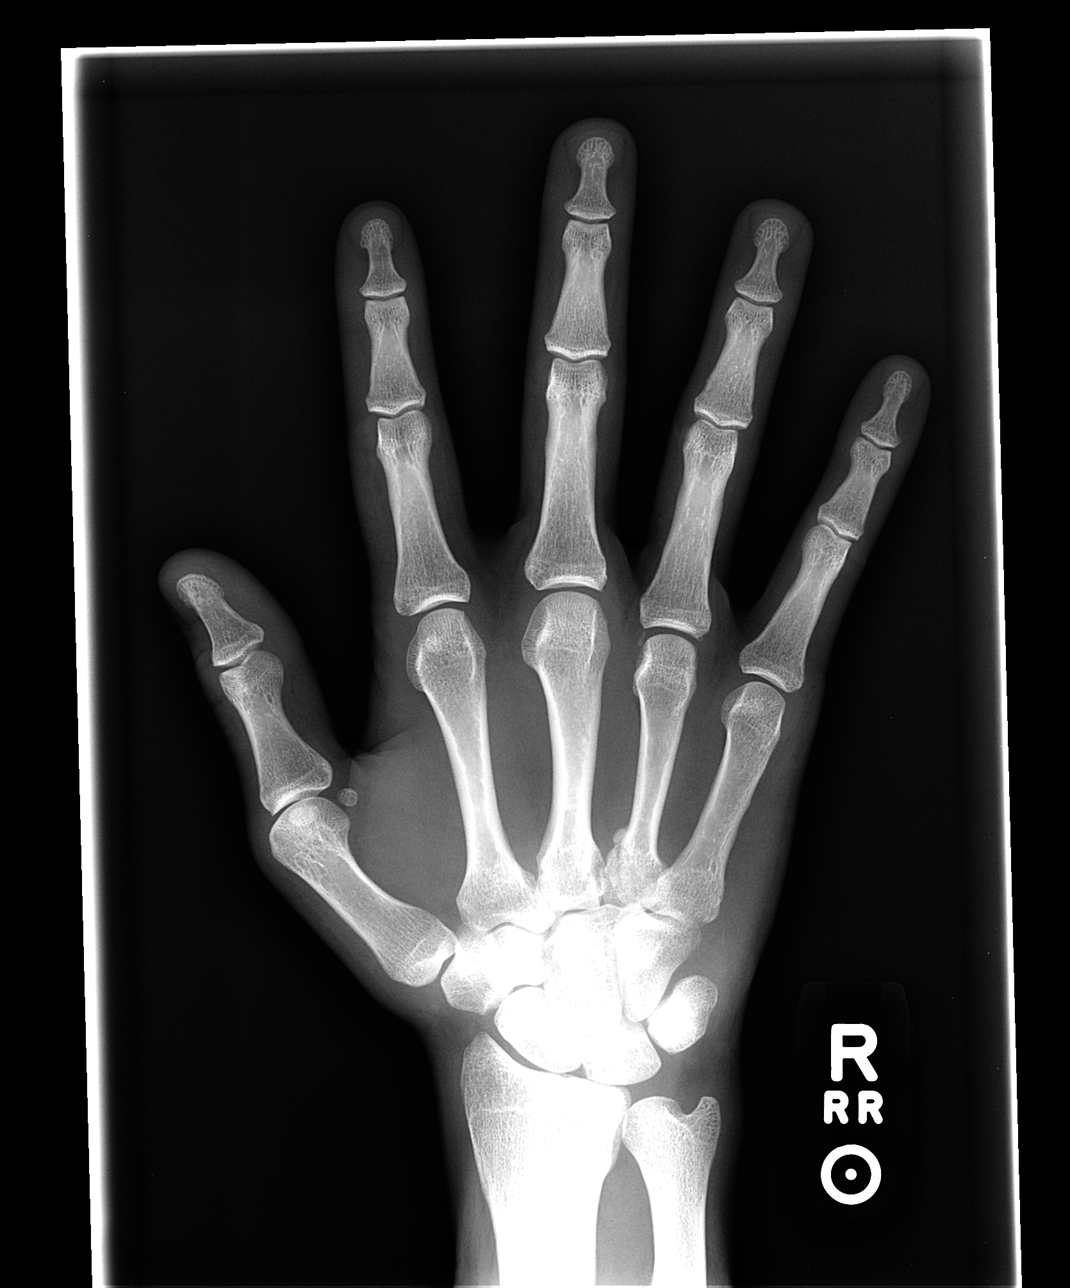

[view not recorded (2 of 3)]
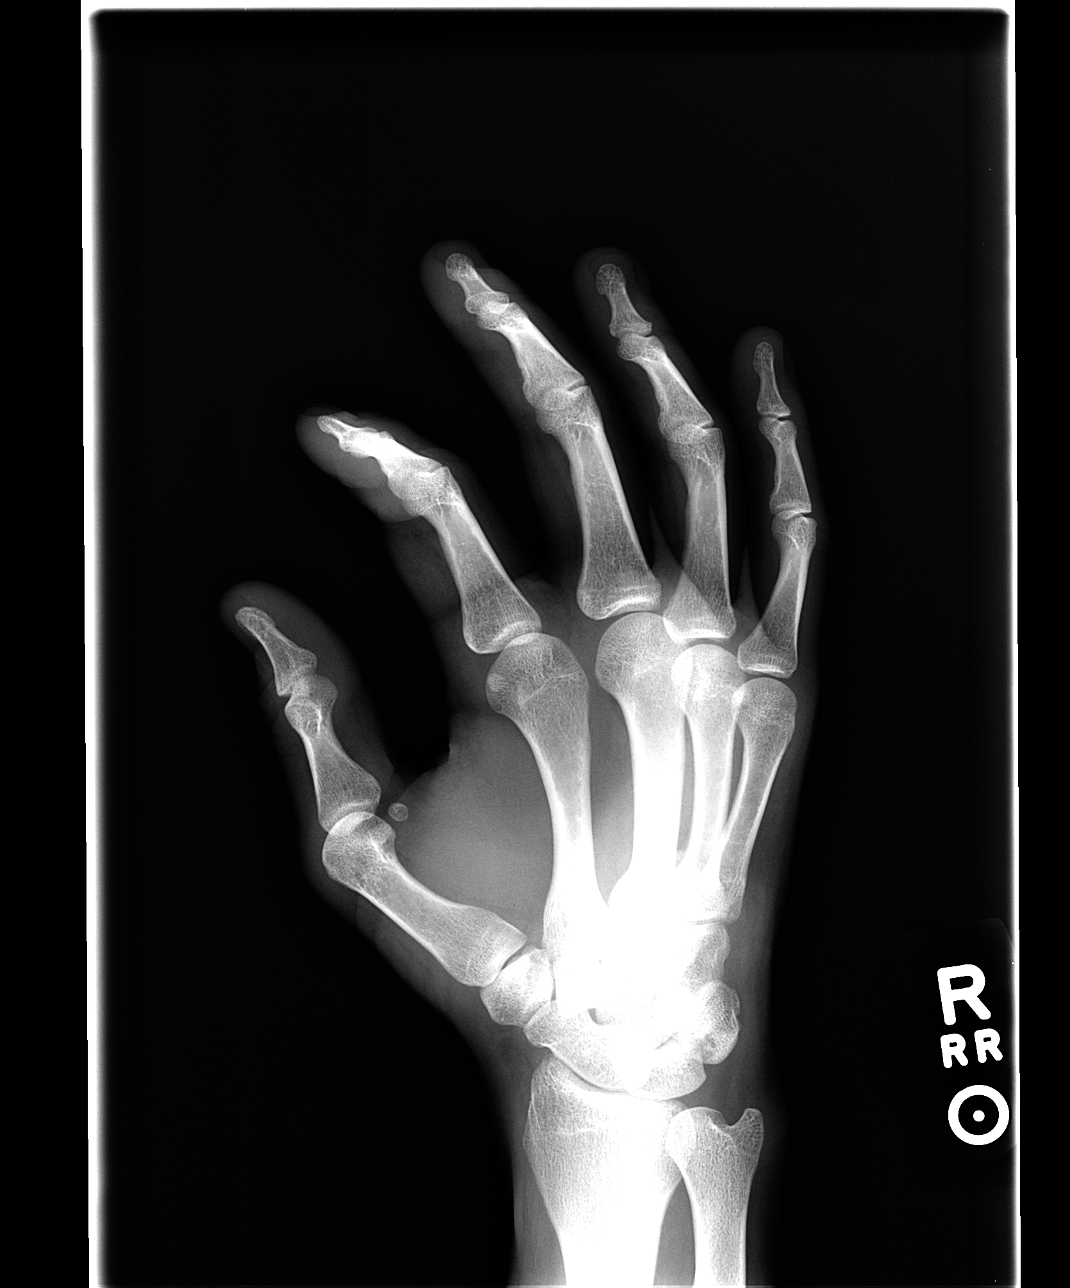

[view not recorded (3 of 3)]
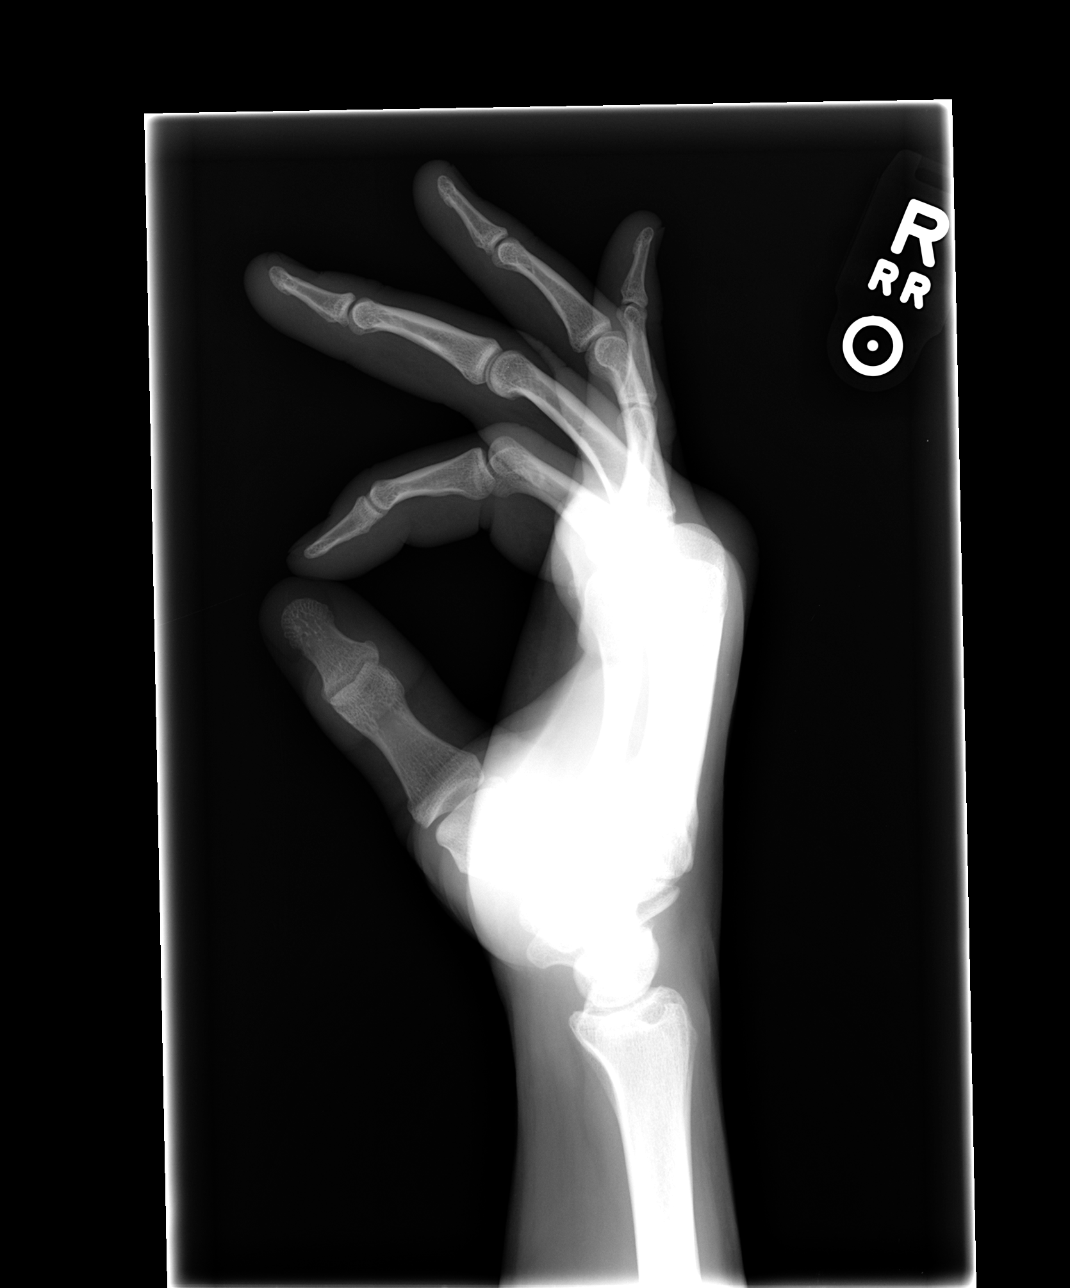

[3 of 3 positions shown; findings below may reference images not displayed]

FINDINGS: Fracture base of the 4th metacarpal with mild displacement,
unchanged. It is difficult to evaluate for bony healing due to bony
overlap.

Lucency at the base of the 5th metacarpal, question healing
nondisplaced fracture versus projection. No definite fracture in
this area on the prior study. No significant arthropathy.
IMPRESSION: Fracture at the base of the 4th metacarpal. It is difficult to
evaluate for bony healing due to bony overlap in the area.

Lucency at the base of the 5th metacarpal could represent a healing
fracture versus projection. No fracture seen in this area on the
prior examination. Correlate with any pain in this area.
